# Patient Record
Sex: Female | Born: 2000 | Race: White | Hispanic: No | Marital: Single | Smoking: Never smoker
Health system: Southern US, Community
[De-identification: ages and names within clinical notes are randomized; demographics above are authoritative.]

## PROBLEM LIST (undated history)

## (undated) DIAGNOSIS — Z789 Other specified health status: Secondary | ICD-10-CM

---
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
## 2023-05-16 ENCOUNTER — Ambulatory Visit
Admission: EM | Admit: 2023-05-16 | Discharge: 2023-05-16 | Disposition: A | Discharge status: Home / Self Care | Payer: 59 | Attending: Family Medicine | Admitting: Family Medicine

## 2023-05-16 DIAGNOSIS — T7840XA Allergy, unspecified, initial encounter: Secondary | ICD-10-CM

## 2023-05-16 DIAGNOSIS — L509 Urticaria, unspecified: Secondary | ICD-10-CM

## 2023-05-16 DIAGNOSIS — T50905A Adverse effect of unspecified drugs, medicaments and biological substances, initial encounter: Secondary | ICD-10-CM | POA: Diagnosis not present

## 2023-05-16 MED ORDER — LORATADINE 10 MG PO TABS: 10.0000 mg | ORAL_TABLET | Freq: Two times a day (BID) | ORAL | 0 refills | Status: AC | PRN

## 2023-05-16 MED ORDER — PREDNISONE 10 MG (21) PO TBPK: ORAL_TABLET | Freq: Every day | ORAL | 0 refills | Status: AC

## 2023-05-16 MED ORDER — HYDROXYZINE HCL 25 MG PO TABS: 25.0000 mg | ORAL_TABLET | Freq: Four times a day (QID) | ORAL | 0 refills | Status: AC

## 2023-05-16 MED ORDER — DEXAMETHASONE SODIUM PHOSPHATE 10 MG/ML IJ SOLN
10.0000 mg | Freq: Once | INTRAMUSCULAR | Status: AC
  Administered 2023-05-16: 10 mg via INTRAMUSCULAR

## 2023-05-16 MED ORDER — TRIAMCINOLONE ACETONIDE 0.1 % EX OINT: 1.0000 | TOPICAL_OINTMENT | Freq: Two times a day (BID) | CUTANEOUS | 0 refills | Status: AC

## 2023-05-16 NOTE — Discharge Instructions (Addendum)
 Stop at the pharmacy to pick up your medications.  Discard the Bactrim.  You have been fully treating for the urinary tract infection and did not need to start a new antibiotic.  Avoid sulfa drugs in the future.

## 2023-05-16 NOTE — ED Provider Notes (Signed)
 [facility]    CSN: [id number] Arrival date & time: 05/16/23  1239      History   Chief Complaint Chief Complaint  Patient presents with   Allergic Reaction   Urticaria    HPI Brenda Hahn is a 23 y.o. female.   HPI  Brenda Hahn presents for itching red rash while taking Bactrim for the past 9 days for urinary tract infection.  The rash showed up yesterday.  Is been no vomiting, diarrhea, chest pain or shortness of breath.  She does not notice any spots inside of her mouth.    Past Medical History:  Diagnosis Date   No known health problems     Patient Active Problem List   Diagnosis Date Noted   Abdominal pain, right lower quadrant 02/04/2021    History reviewed. No pertinent surgical history.  OB History   No obstetric history on file.      Home Medications    Prior to Admission medications   Medication Sig Start Date End Date Taking? Authorizing Brenda Hahn  loratadine  (CLARITIN ) 10 MG tablet Take 1 tablet (10 mg total) by mouth 2 (two) times daily as needed for allergies. 05/16/23  Yes Talmage Teaster, DO  predniSONE  (STERAPRED UNI-PAK 21 TAB) 10 MG (21) TBPK tablet Take by mouth daily. Take 6 tabs by mouth daily for 1, then 5 tabs for 1 day, then 4 tabs for 1 day, then 3 tabs for 1 day, then 2 tabs for 1 day, then 1 tab for 1 day. 05/17/23  Yes Brenda Galiano, DO  triamcinolone  ointment (KENALOG ) 0.1 % Apply 1 Application topically 2 (two) times daily. 05/16/23  Yes Brenda Girgis, DO  albuterol  (VENTOLIN  HFA) 108 (90 Base) MCG/ACT inhaler Inhale 2 puffs into the lungs every 6 (six) hours as needed for wheezing or shortness of breath. Patient not taking: Reported on 02/04/2021 01/27/21   Brenda Aleene KIDD, MD  benzonatate  (TESSALON ) 100 MG capsule Take 1 capsule (100 mg total) by mouth 3 (three) times daily as needed for cough. Patient not taking: Reported on 02/04/2021 01/27/21   Brenda Aleene KIDD, MD  BLISOVI FE 1/20 1-20 MG-MCG tablet Take 1  tablet by mouth daily.   Yes Brenda Hahn, Historical, MD  hydrOXYzine  (ATARAX ) 25 MG tablet Take 1 tablet (25 mg total) by mouth every 6 (six) hours. 05/16/23   Brenda Platts, DO    Family History Family History  Problem Relation Age of Onset   Depression Mother    Healthy Father     Social History Social History   Tobacco Use   Smoking status: Never    Passive exposure: Never   Smokeless tobacco: Never  Vaping Use   Vaping status: Some Days  Substance Use Topics   Alcohol use: No   Drug use: Never     Allergies   Bactrim [sulfamethoxazole-trimethoprim]   Review of Systems Review of Systems :negative unless otherwise stated in HPI.      Physical Exam Triage Vital Signs ED Triage Vitals  Encounter Vitals Group     BP 05/16/23 1320 125/88     Systolic BP Percentile --      Diastolic BP Percentile --      Pulse Rate 05/16/23 1320 (!) 102     Resp 05/16/23 1320 19     Temp 05/16/23 1320 98.3 F (36.8 C)     Temp Source 05/16/23 1320 Oral     SpO2 05/16/23 1320 98 %     Weight --  Height --      Head Circumference --      Peak Flow --      Pain Score 05/16/23 1318 6     Pain Loc --      Pain Education --      Exclude from Growth Chart --    No data found.  Updated Vital Signs BP 125/88 (BP Location: Right Arm)   Pulse (!) 102   Temp 98.3 F (36.8 C) (Oral)   Resp 19   SpO2 98%   Visual Acuity Right Eye Distance:   Left Eye Distance:   Bilateral Distance:    Right Eye Near:   Left Eye Near:    Bilateral Near:     Physical Exam  GEN: alert, well appearing female, scratching EYES: extra occular movements intact, no scleral injection HENT: Moist mucous membranes, no oropharyngeal erythema or lesions, uvula midline and not edematous, CV: regular  rhythm, tachycardic RESP: no increased work of breathing, clear to ascultation bilaterally MSK: no extremity edema, NEURO: alert, alerted at baseline, moves all extremities appropriately, normal  speech SKIN: warm and dry; diffuse erythematous patches and papules on upper and lower extremities, feet sparing the soles, hands including the palms, trunk and abdomen    UC Treatments / Results  Labs (all labs ordered are listed, but only abnormal results are displayed) Labs Reviewed - No data to display  EKG   Radiology No results found.  Procedures Procedures (including critical care time)  Medications Ordered in UC Medications  dexamethasone  (DECADRON ) injection 10 mg (10 mg Intramuscular Given 05/16/23 1347)    Initial Impression / Assessment and Plan / UC Course  I have reviewed the triage vital signs and the nursing notes.  Pertinent labs & imaging results that were available during my care of the patient were reviewed by me and considered in my medical decision making (see chart for details).     Patient is a 23 y.o. femalewho presents for diffuse erythematous rash started yesterday while taking a course of Bactrim.  Overall, patient is well-appearing and well-hydrated.  She is mildly tachycardic at 102.  Brenda Hahn is afebrile.  Satting well on room air.  Exam concerning for urticaria.  Discussed Decadron  injection and she is agreeable.  Decadron  10 mg IM given.  Treat with Claritin  PID, hydroxyzine  as needed, steroid ointment.  She only has the rash and mild tachycardia defer epi use at this time.  Patient has already stopped taking the Bactrim.  Bactrim listed as an allergy on her medication list.  Red flag symptoms discussed with patient and her mother who was present and when to go to the emergency department.  No sign of infection to suggest antibiotics or antifungals at this time.     Reviewed expectations regarding course of current medical issues.  All questions asked were answered.  Outlined signs and symptoms indicating need for more acute intervention. Patient verbalized understanding. After Visit Summary given.   Final Clinical Impressions(s) / UC Diagnoses    Final diagnoses:  Allergic reaction, initial encounter  Urticaria  Adverse effect of drug, initial encounter     Discharge Instructions      Stop at the pharmacy to pick up your medications.  Discard the Bactrim.  You have been fully treating for the urinary tract infection and did not need to start a new antibiotic.  Avoid sulfa drugs in the future.       ED Prescriptions     Medication Sig Dispense Auth.  Brenda Hahn   loratadine  (CLARITIN ) 10 MG tablet Take 1 tablet (10 mg total) by mouth 2 (two) times daily as needed for allergies. 30 tablet Brenda Pulcini, DO   hydrOXYzine  (ATARAX ) 25 MG tablet Take 1 tablet (25 mg total) by mouth every 6 (six) hours. 15 tablet Brenda Mathisen, DO   triamcinolone  ointment (KENALOG ) 0.1 % Apply 1 Application topically 2 (two) times daily. 30 g Brenda Geiselman, DO   predniSONE  (STERAPRED UNI-PAK 21 TAB) 10 MG (21) TBPK tablet Take by mouth daily. Take 6 tabs by mouth daily for 1, then 5 tabs for 1 day, then 4 tabs for 1 day, then 3 tabs for 1 day, then 2 tabs for 1 day, then 1 tab for 1 day. 21 tablet Brenda Fiorillo, DO      PDMP not reviewed this encounter.              Brenda Berth, DO 05/16/23 1422

## 2023-05-16 NOTE — ED Triage Notes (Signed)
 Patient thinks she's having an allergic reaction to bactrim that she started on 05/07/23. Hives appeared yesterday.
